# Patient Record
Sex: Female | Born: 1975 | Race: White | Hispanic: No | Marital: Married | Smoking: Never smoker
Health system: Southern US, Community
[De-identification: ages and names within clinical notes are randomized; demographics above are authoritative.]

## PROBLEM LIST (undated history)

## (undated) DIAGNOSIS — I10 Essential (primary) hypertension: Secondary | ICD-10-CM

## (undated) DIAGNOSIS — N83209 Unspecified ovarian cyst, unspecified side: Secondary | ICD-10-CM

---

## 1995-03-21 HISTORY — PX: OVARIAN CYST REMOVAL: SHX89

## 2016-05-23 ENCOUNTER — Other Ambulatory Visit: Payer: Self-pay | Admitting: Family Medicine

## 2016-05-23 DIAGNOSIS — Z1231 Encounter for screening mammogram for malignant neoplasm of breast: Secondary | ICD-10-CM

## 2016-06-09 ENCOUNTER — Ambulatory Visit
Admission: RE | Admit: 2016-06-09 | Discharge: 2016-06-09 | Disposition: A | Discharge status: Home / Self Care | Payer: BLUE CROSS/BLUE SHIELD | Source: Ambulatory Visit | Attending: Family Medicine | Admitting: Family Medicine

## 2016-06-09 DIAGNOSIS — Z1231 Encounter for screening mammogram for malignant neoplasm of breast: Secondary | ICD-10-CM

## 2016-06-12 ENCOUNTER — Other Ambulatory Visit: Payer: Self-pay | Admitting: Family Medicine

## 2016-06-12 DIAGNOSIS — R928 Other abnormal and inconclusive findings on diagnostic imaging of breast: Secondary | ICD-10-CM

## 2016-06-16 ENCOUNTER — Ambulatory Visit
Admission: RE | Admit: 2016-06-16 | Discharge: 2016-06-16 | Disposition: A | Discharge status: Home / Self Care | Payer: BLUE CROSS/BLUE SHIELD | Source: Ambulatory Visit | Attending: Family Medicine | Admitting: Family Medicine

## 2016-06-16 DIAGNOSIS — R928 Other abnormal and inconclusive findings on diagnostic imaging of breast: Secondary | ICD-10-CM

## 2017-06-04 ENCOUNTER — Other Ambulatory Visit: Payer: Self-pay | Admitting: Family Medicine

## 2017-06-04 DIAGNOSIS — Z1231 Encounter for screening mammogram for malignant neoplasm of breast: Secondary | ICD-10-CM

## 2017-06-19 ENCOUNTER — Ambulatory Visit
Admission: RE | Admit: 2017-06-19 | Discharge: 2017-06-19 | Disposition: A | Discharge status: Home / Self Care | Payer: BLUE CROSS/BLUE SHIELD | Source: Ambulatory Visit | Attending: Family Medicine | Admitting: Family Medicine

## 2017-06-19 DIAGNOSIS — Z1231 Encounter for screening mammogram for malignant neoplasm of breast: Secondary | ICD-10-CM

## 2018-03-20 HISTORY — PX: TUBAL LIGATION: SHX77

## 2018-08-19 ENCOUNTER — Other Ambulatory Visit: Payer: Self-pay | Admitting: Internal Medicine

## 2018-08-19 ENCOUNTER — Other Ambulatory Visit: Payer: Self-pay | Admitting: Family Medicine

## 2018-08-19 DIAGNOSIS — Z1231 Encounter for screening mammogram for malignant neoplasm of breast: Secondary | ICD-10-CM

## 2018-08-20 ENCOUNTER — Ambulatory Visit
Admission: RE | Admit: 2018-08-20 | Discharge: 2018-08-20 | Disposition: A | Discharge status: Home / Self Care | Payer: BC Managed Care – PPO | Source: Ambulatory Visit | Attending: Internal Medicine | Admitting: Internal Medicine

## 2018-08-20 ENCOUNTER — Other Ambulatory Visit: Payer: Self-pay

## 2018-08-20 DIAGNOSIS — Z1231 Encounter for screening mammogram for malignant neoplasm of breast: Secondary | ICD-10-CM

## 2019-11-10 ENCOUNTER — Other Ambulatory Visit: Payer: Self-pay | Admitting: Internal Medicine

## 2019-11-10 DIAGNOSIS — Z1231 Encounter for screening mammogram for malignant neoplasm of breast: Secondary | ICD-10-CM

## 2019-11-12 ENCOUNTER — Other Ambulatory Visit: Payer: Self-pay

## 2019-11-12 ENCOUNTER — Ambulatory Visit
Admission: RE | Admit: 2019-11-12 | Discharge: 2019-11-12 | Disposition: A | Discharge status: Home / Self Care | Payer: BLUE CROSS/BLUE SHIELD | Source: Ambulatory Visit | Attending: Internal Medicine | Admitting: Internal Medicine

## 2019-11-12 DIAGNOSIS — Z1231 Encounter for screening mammogram for malignant neoplasm of breast: Secondary | ICD-10-CM

## 2020-12-13 ENCOUNTER — Other Ambulatory Visit: Payer: Self-pay | Admitting: Internal Medicine

## 2020-12-13 DIAGNOSIS — Z1231 Encounter for screening mammogram for malignant neoplasm of breast: Secondary | ICD-10-CM

## 2021-01-06 ENCOUNTER — Ambulatory Visit
Admission: RE | Admit: 2021-01-06 | Discharge: 2021-01-06 | Disposition: A | Discharge status: Home / Self Care | Payer: BC Managed Care – PPO | Source: Ambulatory Visit | Attending: Internal Medicine | Admitting: Internal Medicine

## 2021-01-06 ENCOUNTER — Other Ambulatory Visit: Payer: Self-pay

## 2021-01-06 DIAGNOSIS — Z1231 Encounter for screening mammogram for malignant neoplasm of breast: Secondary | ICD-10-CM

## 2021-01-11 ENCOUNTER — Other Ambulatory Visit: Payer: Self-pay | Admitting: Internal Medicine

## 2021-01-11 DIAGNOSIS — R928 Other abnormal and inconclusive findings on diagnostic imaging of breast: Secondary | ICD-10-CM

## 2021-01-28 ENCOUNTER — Ambulatory Visit
Admission: RE | Admit: 2021-01-28 | Discharge: 2021-01-28 | Disposition: A | Discharge status: Home / Self Care | Payer: BC Managed Care – PPO | Source: Ambulatory Visit | Attending: Internal Medicine | Admitting: Internal Medicine

## 2021-01-28 ENCOUNTER — Other Ambulatory Visit: Payer: Self-pay | Admitting: Internal Medicine

## 2021-01-28 ENCOUNTER — Other Ambulatory Visit: Payer: Self-pay

## 2021-01-28 DIAGNOSIS — R928 Other abnormal and inconclusive findings on diagnostic imaging of breast: Secondary | ICD-10-CM

## 2021-07-29 ENCOUNTER — Other Ambulatory Visit: Payer: BC Managed Care – PPO

## 2022-08-07 ENCOUNTER — Ambulatory Visit (HOSPITAL_BASED_OUTPATIENT_CLINIC_OR_DEPARTMENT_OTHER)
Admission: EM | Admit: 2022-08-07 | Discharge: 2022-08-08 | Disposition: A | Discharge status: Home / Self Care | Payer: BC Managed Care – PPO | Attending: Surgery | Admitting: Surgery

## 2022-08-07 ENCOUNTER — Encounter (HOSPITAL_BASED_OUTPATIENT_CLINIC_OR_DEPARTMENT_OTHER): Payer: Self-pay | Admitting: Urology

## 2022-08-07 ENCOUNTER — Emergency Department (HOSPITAL_BASED_OUTPATIENT_CLINIC_OR_DEPARTMENT_OTHER): Payer: BC Managed Care – PPO

## 2022-08-07 DIAGNOSIS — K3580 Unspecified acute appendicitis: Secondary | ICD-10-CM | POA: Diagnosis present

## 2022-08-07 DIAGNOSIS — I10 Essential (primary) hypertension: Secondary | ICD-10-CM | POA: Insufficient documentation

## 2022-08-07 HISTORY — DX: Unspecified ovarian cyst, unspecified side: N83.209

## 2022-08-07 HISTORY — DX: Essential (primary) hypertension: I10

## 2022-08-07 LAB — COMPREHENSIVE METABOLIC PANEL
ALT: 23 U/L (ref 0–44)
AST: 28 U/L (ref 15–41)
Albumin: 3.6 g/dL (ref 3.5–5.0)
Alkaline Phosphatase: 53 U/L (ref 38–126)
Anion gap: 8 (ref 5–15)
BUN: 11 mg/dL (ref 6–20)
CO2: 28 mmol/L (ref 22–32)
Calcium: 8.6 mg/dL — ABNORMAL LOW (ref 8.9–10.3)
Chloride: 99 mmol/L (ref 98–111)
Creatinine, Ser: 0.76 mg/dL (ref 0.44–1.00)
GFR, Estimated: >60 mL/min (ref >60)
Glucose, Bld: 133 mg/dL — ABNORMAL HIGH (ref 70–99)
Potassium: 3.2 mmol/L — ABNORMAL LOW (ref 3.5–5.1)
Sodium: 135 mmol/L (ref 135–145)
Total Bilirubin: 0.2 mg/dL — ABNORMAL LOW (ref 0.3–1.2)
Total Protein: 6.7 g/dL (ref 6.5–8.1)

## 2022-08-07 LAB — CBC WITH DIFFERENTIAL/PLATELET
Abs Immature Granulocytes: 0.04 10*3/uL (ref 0.00–0.07)
Basophils Absolute: 0 10*3/uL (ref 0.0–0.1)
Basophils Relative: 0 %
Eosinophils Absolute: 0 10*3/uL (ref 0.0–0.5)
Eosinophils Relative: 0 %
HCT: 37.4 % (ref 36.0–46.0)
Hemoglobin: 12.8 g/dL (ref 12.0–15.0)
Immature Granulocytes: 0 %
Lymphocytes Relative: 8 %
Lymphs Abs: 1.3 10*3/uL (ref 0.7–4.0)
MCH: 31.5 pg (ref 26.0–34.0)
MCHC: 34.2 g/dL (ref 30.0–36.0)
MCV: 92.1 fL (ref 80.0–100.0)
Monocytes Absolute: 1.4 10*3/uL — ABNORMAL HIGH (ref 0.1–1.0)
Monocytes Relative: 9 %
Neutro Abs: 12.8 10*3/uL — ABNORMAL HIGH (ref 1.7–7.7)
Neutrophils Relative %: 83 %
Platelets: 255 10*3/uL (ref 150–400)
RBC: 4.06 MIL/uL (ref 3.87–5.11)
RDW: 13.1 % (ref 11.5–15.5)
WBC: 15.6 10*3/uL — ABNORMAL HIGH (ref 4.0–10.5)
nRBC: 0 % (ref 0.0–0.2)

## 2022-08-07 LAB — URINALYSIS, ROUTINE W REFLEX MICROSCOPIC
Bilirubin Urine: NEGATIVE
Glucose, UA: 100 mg/dL — AB
Ketones, ur: NEGATIVE mg/dL
Leukocytes,Ua: NEGATIVE
Nitrite: NEGATIVE
Protein, ur: NEGATIVE mg/dL
Specific Gravity, Urine: 1.01 (ref 1.005–1.030)
pH: 5 (ref 5.0–8.0)

## 2022-08-07 LAB — LIPASE, BLOOD: Lipase: 42 U/L (ref 11–51)

## 2022-08-07 LAB — URINALYSIS, MICROSCOPIC (REFLEX): RBC / HPF: >50 RBC/hpf (ref 0–5)

## 2022-08-07 LAB — HCG, SERUM, QUALITATIVE: Preg, Serum: NEGATIVE

## 2022-08-07 MED ORDER — CIPROFLOXACIN IN D5W 400 MG/200ML IV SOLN
400.0000 mg | Freq: Once | INTRAVENOUS | Status: AC
  Administered 2022-08-07: 400 mg via INTRAVENOUS
  Filled 2022-08-07: qty 200

## 2022-08-07 MED ORDER — IOHEXOL 300 MG/ML  SOLN
100.0000 mL | Freq: Once | INTRAMUSCULAR | Status: AC | PRN
  Administered 2022-08-07: 100 mL via INTRAVENOUS

## 2022-08-07 MED ORDER — FENTANYL CITRATE PF 50 MCG/ML IJ SOSY
50.0000 ug | PREFILLED_SYRINGE | Freq: Once | INTRAMUSCULAR | Status: AC
  Administered 2022-08-07: 50 ug via INTRAVENOUS
  Filled 2022-08-07: qty 1

## 2022-08-07 MED ORDER — ACETAMINOPHEN 325 MG PO TABS
650.0000 mg | ORAL_TABLET | Freq: Once | ORAL | Status: AC
  Administered 2022-08-07: 650 mg via ORAL
  Filled 2022-08-07: qty 2

## 2022-08-07 MED ORDER — SODIUM CHLORIDE 0.9 % IV BOLUS
1000.0000 mL | Freq: Once | INTRAVENOUS | Status: AC
  Administered 2022-08-07: 1000 mL via INTRAVENOUS

## 2022-08-07 MED ORDER — METRONIDAZOLE 500 MG/100ML IV SOLN
500.0000 mg | Freq: Once | INTRAVENOUS | Status: AC
  Administered 2022-08-07: 500 mg via INTRAVENOUS
  Filled 2022-08-07: qty 100

## 2022-08-07 NOTE — ED Provider Notes (Signed)
[place] [facility] Provider Note   CSN: [id number] Arrival date & time: 08/07/22  2017     History  Chief Complaint  Patient presents with   Abdominal Pain    Misty Hester is a 47 y.o. female.  Patient here with right lower quadrant abdominal pain.  Worsening now over the last 24 hours but started maybe 2 to 3 days ago.  History of ovarian cyst on the left.  Currently on her menstrual cycle.  She saw her primary care doctor today who thought she needed to be ruled out for appendicitis.  Supposedly they gave a dose of Toradol and Flomax in the office.  May be prescribed antibiotics but not quite sure for what.  She is not having any pain with urination or blood in the urine.  She has had fallopian tubes removed in the past and left ovary removed in the past.  She denies any history of kidney stones.  No right upper quadrant pain.  No nausea vomiting diarrhea.  She has had a low-grade fever here the last 24 hours.  Denies any pain with urination.  Denies any vaginal discharge.  The history is provided by the patient.       Home Medications Prior to Admission medications   Not on File      Allergies    Penicillins    Review of Systems   Review of Systems  Gastrointestinal:  Positive for abdominal pain.    Physical Exam Updated Vital Signs BP 111/83   Pulse 96   Temp 99.9 F (37.7 C) (Oral)   Resp 17   Ht 5\' 6"  (1.676 m)   Wt 68 kg   LMP 07/31/2022 (Approximate) Comment: IUD rmoved 10/20, irregular periods since.  SpO2 100%   BMI 24.21 kg/m  Physical Exam Vitals and nursing note reviewed.  Constitutional:      General: She is not in acute distress.    Appearance: She is well-developed.  HENT:     Head: Normocephalic and atraumatic.     Mouth/Throat:     Mouth: Mucous membranes are moist.  Eyes:     Extraocular Movements: Extraocular movements intact.     Conjunctiva/sclera: Conjunctivae normal.  Cardiovascular:      Rate and Rhythm: Normal rate and regular rhythm.     Heart sounds: Normal heart sounds. No murmur heard. Pulmonary:     Effort: Pulmonary effort is normal. No respiratory distress.     Breath sounds: Normal breath sounds.  Abdominal:     Palpations: Abdomen is soft.     Tenderness: There is abdominal tenderness in the right lower quadrant.  Musculoskeletal:        General: No swelling.     Cervical back: Neck supple.  Skin:    General: Skin is warm and dry.     Capillary Refill: Capillary refill takes less than 2 seconds.  Neurological:     Mental Status: She is alert.  Psychiatric:        Mood and Affect: Mood normal.     ED Results / Procedures / Treatments   Labs (all labs ordered are listed, but only abnormal results are displayed) Labs Reviewed  CBC WITH DIFFERENTIAL/PLATELET - Abnormal; Notable for the following components:      Result Value   WBC 15.6 (*)    Neutro Abs 12.8 (*)    Monocytes Absolute 1.4 (*)    All other components within normal limits  COMPREHENSIVE METABOLIC PANEL - Abnormal; Notable for the following components:   Potassium 3.2 (*)    Glucose, Bld 133 (*)    Calcium 8.6 (*)    Total Bilirubin 0.2 (*)    All other components within normal limits  URINALYSIS, ROUTINE W REFLEX MICROSCOPIC - Abnormal; Notable for the following components:   Glucose, UA 100 (*)    Hgb urine dipstick SMALL (*)    All other components within normal limits  URINALYSIS, MICROSCOPIC (REFLEX) - Abnormal; Notable for the following components:   Bacteria, UA FEW (*)    All other components within normal limits  LIPASE, BLOOD  HCG, SERUM, QUALITATIVE    EKG None  Radiology CT ABDOMEN PELVIS W CONTRAST  Result Date: 08/07/2022 CLINICAL DATA:  Abdominal pain. EXAM: CT ABDOMEN AND PELVIS WITH CONTRAST TECHNIQUE: Multidetector CT imaging of the abdomen and pelvis was performed using the standard protocol following bolus administration of intravenous contrast.  RADIATION DOSE REDUCTION: This exam was performed according to the departmental dose-optimization program which includes automated exposure control, adjustment of the mA and/or kV according to patient size and/or use of iterative reconstruction technique. CONTRAST:  OMNIPAQUE IOHEXOL 300 MG/ML  SOLN COMPARISON:  None Available. FINDINGS: Lower chest: The visualized lung bases are clear. No intra-abdominal free air or free fluid. Hepatobiliary: No focal liver abnormality is seen. No gallstones, gallbladder wall thickening, or biliary dilatation. Pancreas: Unremarkable. No pancreatic ductal dilatation or surrounding inflammatory changes. Spleen: Normal in size without focal abnormality. Adrenals/Urinary Tract: The right adrenal glands unremarkable. There is a 2 cm indeterminate left adrenal nodule, possibly adenoma. This can be better characterized with MRI without and with contrast on a nonemergent/outpatient basis. Small left renal upper pole cyst. There is no hydronephrosis on either side. The visualized ureters and the urinary bladder appear unremarkable. Stomach/Bowel: Several small scattered colonic diverticula without active inflammatory changes. There is no bowel obstruction. The appendix is enlarged and inflamed measuring up to 11 mm in diameter. There is a stone in the lumen of the appendix. The appendix is located in the right lower quadrant posterior to the cecum and ascending colon. No drainable fluid collection/abscess. No evidence of perforation. Vascular/Lymphatic: The abdominal aorta and IVC are unremarkable. No portal venous gas. There is no adenopathy. Reproductive: The uterus is anteverted.  No adnexal masses. Other: None Musculoskeletal: No acute or significant osseous findings. IMPRESSION: 1. Acute appendicitis. No abscess or evidence of perforation. 2. Colonic diverticulosis. No bowel obstruction. 3. Indeterminate left adrenal nodule, possibly adenoma. This can be better characterized with  MRI without and with contrast on a nonemergent/outpatient basis. At Electronically Signed   By: Elgie Collard M.D.   On: 08/07/2022 21:45    Procedures Procedures    Medications Ordered in ED Medications  ciprofloxacin (CIPRO) IVPB 400 mg (has no administration in time range)    And  metroNIDAZOLE (FLAGYL) IVPB 500 mg (has no administration in time range)  acetaminophen (TYLENOL) tablet 650 mg (650 mg Oral Given 08/07/22 2048)  sodium chloride 0.9 % bolus 1,000 mL (1,000 mLs Intravenous New Bag/Given 08/07/22 2050)  fentaNYL (SUBLIMAZE) injection 50 mcg (50 mcg Intravenous Given 08/07/22 2048)  iohexol (OMNIPAQUE) 300 MG/ML solution 100 mL (100 mLs Intravenous Contrast Given 08/07/22 2123)    ED Course/ Medical Decision Making/ A&P                             Medical Decision Making Amount and/or  Complexity of Data Reviewed Labs: ordered. Radiology: ordered.  Risk OTC drugs. Prescription drug management. Decision regarding hospitalization.   Garlan Fair is here with right lower quadrant abdominal pain.  Low-grade fever, tachycardia.  Differential diagnosis includes appendicitis versus UTI versus less likely bowel obstruction, ovarian process.  Patient to be given IV fluids, IV fentanyl, IV Zofran.  Per my review and interpretation of labs she has a white count of 15 but otherwise hemodynamically stable.  Lab works unremarkable.  CT scan per radiology report shows acute appendicitis.  Talked with Dr. Maisie Fus with general surgery.  Will keep her here overnight and she will be sent to preop in the morning.  Patient handed off to oncoming ED staff with plan in place.  This chart was dictated using voice recognition software.  Despite best efforts to proofread,  errors can occur which can change the documentation meaning.         Final Clinical Impression(s) / ED Diagnoses Final diagnoses:  Acute appendicitis, unspecified acute appendicitis type    Rx / DC  Orders ED Discharge Orders     None         Virgina Norfolk, DO 08/07/22 2204

## 2022-08-07 NOTE — ED Triage Notes (Signed)
Pt states pain that started in lower back on Saturday and now having RLQ pain, PCP sent to r/o appendicitis  Denies N/V/D, low grade fever noted at home   Toradol and flomax at 1600 at office Last ate at 1800

## 2022-08-07 NOTE — ED Notes (Signed)
Pt c/o RLQ pain that began in her flank area a couple days ago but is now only in her abdomen. Denies NVD. Sts possible fevers at home. Hx of ovarian cysts on the left side that made her left ovary rupture but has right ovary. Currently on her menstrual cycle.

## 2022-08-08 ENCOUNTER — Emergency Department (HOSPITAL_COMMUNITY): Payer: BC Managed Care – PPO | Admitting: Anesthesiology

## 2022-08-08 ENCOUNTER — Other Ambulatory Visit: Payer: Self-pay

## 2022-08-08 ENCOUNTER — Encounter (HOSPITAL_COMMUNITY): Payer: Self-pay

## 2022-08-08 ENCOUNTER — Encounter (HOSPITAL_COMMUNITY)
Admission: EM | Disposition: A | Discharge status: Home / Self Care | Payer: Self-pay | Source: Home / Self Care | Attending: Emergency Medicine

## 2022-08-08 DIAGNOSIS — I10 Essential (primary) hypertension: Secondary | ICD-10-CM | POA: Diagnosis not present

## 2022-08-08 DIAGNOSIS — K3580 Unspecified acute appendicitis: Secondary | ICD-10-CM | POA: Diagnosis present

## 2022-08-08 HISTORY — PX: LAPAROSCOPIC APPENDECTOMY: SHX408

## 2022-08-08 SURGERY — APPENDECTOMY, LAPAROSCOPIC
Anesthesia: General

## 2022-08-08 MED ORDER — LIDOCAINE HCL (CARDIAC) PF 100 MG/5ML IV SOSY
PREFILLED_SYRINGE | INTRAVENOUS | Status: DC | PRN
  Administered 2022-08-08: 100 mg via INTRAVENOUS

## 2022-08-08 MED ORDER — 0.9 % SODIUM CHLORIDE (POUR BTL) OPTIME
TOPICAL | Status: DC | PRN
  Administered 2022-08-08: 1000 mL

## 2022-08-08 MED ORDER — LACTATED RINGERS IR SOLN
Status: DC | PRN
  Administered 2022-08-08: 1000 mL

## 2022-08-08 MED ORDER — FENTANYL CITRATE PF 50 MCG/ML IJ SOSY
50.0000 ug | PREFILLED_SYRINGE | INTRAMUSCULAR | Status: DC | PRN
  Administered 2022-08-08: 50 ug via INTRAVENOUS
  Filled 2022-08-08: qty 1

## 2022-08-08 MED ORDER — SUCCINYLCHOLINE CHLORIDE 200 MG/10ML IV SOSY
PREFILLED_SYRINGE | INTRAVENOUS | Status: AC
  Filled 2022-08-08: qty 10

## 2022-08-08 MED ORDER — PROPOFOL 10 MG/ML IV BOLUS
INTRAVENOUS | Status: AC
  Filled 2022-08-08: qty 20

## 2022-08-08 MED ORDER — ROCURONIUM BROMIDE 100 MG/10ML IV SOLN
INTRAVENOUS | Status: DC | PRN
  Administered 2022-08-08: 40 mg via INTRAVENOUS

## 2022-08-08 MED ORDER — FENTANYL CITRATE (PF) 100 MCG/2ML IJ SOLN
INTRAMUSCULAR | Status: AC
  Filled 2022-08-08: qty 2

## 2022-08-08 MED ORDER — BUPIVACAINE HCL (PF) 0.5 % IJ SOLN
INTRAMUSCULAR | Status: AC
  Filled 2022-08-08: qty 60

## 2022-08-08 MED ORDER — ACETAMINOPHEN 10 MG/ML IV SOLN: 1000.0000 mg | Freq: Once | INTRAVENOUS | Status: DC | PRN

## 2022-08-08 MED ORDER — ONDANSETRON HCL 4 MG/2ML IJ SOLN
INTRAMUSCULAR | Status: DC | PRN
  Administered 2022-08-08: 4 mg via INTRAVENOUS

## 2022-08-08 MED ORDER — FENTANYL CITRATE (PF) 100 MCG/2ML IJ SOLN
INTRAMUSCULAR | Status: DC | PRN
  Administered 2022-08-08 (×4): 50 ug via INTRAVENOUS

## 2022-08-08 MED ORDER — ONDANSETRON HCL 4 MG/2ML IJ SOLN
INTRAMUSCULAR | Status: AC
  Filled 2022-08-08: qty 2

## 2022-08-08 MED ORDER — CIPROFLOXACIN IN D5W 400 MG/200ML IV SOLN
400.0000 mg | Freq: Once | INTRAVENOUS | Status: AC
  Administered 2022-08-08: 400 mg via INTRAVENOUS
  Filled 2022-08-08: qty 200

## 2022-08-08 MED ORDER — MIDAZOLAM HCL 5 MG/5ML IJ SOLN
INTRAMUSCULAR | Status: DC | PRN
  Administered 2022-08-08: 2 mg via INTRAVENOUS

## 2022-08-08 MED ORDER — SUGAMMADEX SODIUM 200 MG/2ML IV SOLN
INTRAVENOUS | Status: DC | PRN
  Administered 2022-08-08: 200 mg via INTRAVENOUS

## 2022-08-08 MED ORDER — LACTATED RINGERS IV SOLN: INTRAVENOUS | Status: DC

## 2022-08-08 MED ORDER — PROPOFOL 10 MG/ML IV BOLUS
INTRAVENOUS | Status: DC | PRN
  Administered 2022-08-08: 140 mg via INTRAVENOUS

## 2022-08-08 MED ORDER — CHLORHEXIDINE GLUCONATE 0.12 % MT SOLN
15.0000 mL | Freq: Once | OROMUCOSAL | Status: AC
  Administered 2022-08-08: 15 mL via OROMUCOSAL

## 2022-08-08 MED ORDER — BUPIVACAINE HCL (PF) 0.5 % IJ SOLN
INTRAMUSCULAR | Status: DC | PRN
  Administered 2022-08-08: 20 mL

## 2022-08-08 MED ORDER — ROCURONIUM BROMIDE 10 MG/ML (PF) SYRINGE
PREFILLED_SYRINGE | INTRAVENOUS | Status: AC
  Filled 2022-08-08: qty 10

## 2022-08-08 MED ORDER — LIDOCAINE HCL (PF) 2 % IJ SOLN
INTRAMUSCULAR | Status: AC
  Filled 2022-08-08: qty 5

## 2022-08-08 MED ORDER — MIDAZOLAM HCL 2 MG/2ML IJ SOLN
INTRAMUSCULAR | Status: AC
  Filled 2022-08-08: qty 2

## 2022-08-08 MED ORDER — OXYCODONE HCL 5 MG PO TABS
ORAL_TABLET | ORAL | Status: AC
  Filled 2022-08-08: qty 1

## 2022-08-08 MED ORDER — SUCCINYLCHOLINE CHLORIDE 200 MG/10ML IV SOSY
PREFILLED_SYRINGE | INTRAVENOUS | Status: DC | PRN
  Administered 2022-08-08: 100 mg via INTRAVENOUS

## 2022-08-08 MED ORDER — DEXAMETHASONE SODIUM PHOSPHATE 10 MG/ML IJ SOLN
INTRAMUSCULAR | Status: AC
  Filled 2022-08-08: qty 1

## 2022-08-08 MED ORDER — DEXAMETHASONE SODIUM PHOSPHATE 10 MG/ML IJ SOLN
INTRAMUSCULAR | Status: DC | PRN
  Administered 2022-08-08: 8 mg via INTRAVENOUS

## 2022-08-08 MED ORDER — FENTANYL CITRATE PF 50 MCG/ML IJ SOSY: 25.0000 ug | PREFILLED_SYRINGE | INTRAMUSCULAR | Status: DC | PRN

## 2022-08-08 MED ORDER — AMOXICILLIN-POT CLAVULANATE 875-125 MG PO TABS: 1.0000 | ORAL_TABLET | Freq: Two times a day (BID) | ORAL | 0 refills | Status: AC

## 2022-08-08 MED ORDER — OXYCODONE HCL 5 MG/5ML PO SOLN: 5.0000 mg | Freq: Once | ORAL | Status: AC | PRN

## 2022-08-08 MED ORDER — TRAMADOL HCL 50 MG PO TABS: 50.0000 mg | ORAL_TABLET | Freq: Four times a day (QID) | ORAL | 0 refills | Status: AC | PRN

## 2022-08-08 MED ORDER — METRONIDAZOLE 500 MG/100ML IV SOLN
500.0000 mg | Freq: Once | INTRAVENOUS | Status: AC
  Administered 2022-08-08: 500 mg via INTRAVENOUS
  Filled 2022-08-08: qty 100

## 2022-08-08 MED ORDER — OXYCODONE HCL 5 MG PO TABS
5.0000 mg | ORAL_TABLET | Freq: Once | ORAL | Status: AC | PRN
  Administered 2022-08-08: 5 mg via ORAL

## 2022-08-08 MED ORDER — ONDANSETRON HCL 4 MG/2ML IJ SOLN: 4.0000 mg | Freq: Once | INTRAMUSCULAR | Status: DC | PRN

## 2022-08-08 MED ORDER — SODIUM CHLORIDE 0.9 % IV SOLN: Freq: Once | INTRAVENOUS | Status: AC

## 2022-08-08 SURGICAL SUPPLY — 39 items
ADH SKN CLS APL DERMABOND .7 (GAUZE/BANDAGES/DRESSINGS) ×1
APL PRP STRL LF DISP 70% ISPRP (MISCELLANEOUS) ×1
APPLIER CLIP 5 13 M/L LIGAMAX5 (MISCELLANEOUS)
APR CLP MED LRG 5 ANG JAW (MISCELLANEOUS)
BAG COUNTER SPONGE SURGICOUNT (BAG) IMPLANT
BAG SPNG CNTER NS LX DISP (BAG)
CABLE HIGH FREQUENCY MONO STRZ (ELECTRODE) ×1 IMPLANT
CHLORAPREP W/TINT 26 (MISCELLANEOUS) ×1 IMPLANT
CLIP APPLIE 5 13 M/L LIGAMAX5 (MISCELLANEOUS) IMPLANT
COVER SURGICAL LIGHT HANDLE (MISCELLANEOUS) ×1 IMPLANT
CUTTER FLEX LINEAR 45M (STAPLE) IMPLANT
DERMABOND ADVANCED .7 DNX12 (GAUZE/BANDAGES/DRESSINGS) ×1 IMPLANT
DRAPE LAPAROSCOPIC ABDOMINAL (DRAPES) ×1 IMPLANT
ELECT REM PT RETURN 15FT ADLT (MISCELLANEOUS) ×1 IMPLANT
GLOVE BIO SURGEON STRL SZ7.5 (GLOVE) ×1 IMPLANT
GOWN STRL REUS W/ TWL XL LVL3 (GOWN DISPOSABLE) ×2 IMPLANT
GOWN STRL REUS W/TWL XL LVL3 (GOWN DISPOSABLE) ×2
IRRIG SUCT STRYKERFLOW 2 WTIP (MISCELLANEOUS) ×1
IRRIGATION SUCT STRKRFLW 2 WTP (MISCELLANEOUS) ×1 IMPLANT
KIT BASIN OR (CUSTOM PROCEDURE TRAY) ×1 IMPLANT
KIT TURNOVER KIT A (KITS) IMPLANT
PENCIL SMOKE EVACUATOR (MISCELLANEOUS) IMPLANT
RELOAD 45 VASCULAR/THIN (ENDOMECHANICALS) IMPLANT
RELOAD STAPLE 45 2.5 WHT GRN (ENDOMECHANICALS) IMPLANT
RELOAD STAPLE 45 3.5 BLU ETS (ENDOMECHANICALS) IMPLANT
RELOAD STAPLE TA45 3.5 REG BLU (ENDOMECHANICALS) ×1 IMPLANT
SET TUBE SMOKE EVAC HIGH FLOW (TUBING) ×1 IMPLANT
SHEARS HARMONIC ACE PLUS 36CM (ENDOMECHANICALS) IMPLANT
SLEEVE Z-THREAD 5X100MM (TROCAR) ×1 IMPLANT
SPIKE FLUID TRANSFER (MISCELLANEOUS) ×1 IMPLANT
SUT MNCRL AB 4-0 PS2 18 (SUTURE) ×1 IMPLANT
SYS BAG RETRIEVAL 10MM (BASKET) ×1
SYSTEM BAG RETRIEVAL 10MM (BASKET) ×1 IMPLANT
TOWEL OR 17X26 10 PK STRL BLUE (TOWEL DISPOSABLE) ×1 IMPLANT
TOWEL OR NON WOVEN STRL DISP B (DISPOSABLE) ×1 IMPLANT
TRAY FOLEY MTR SLVR 16FR STAT (SET/KITS/TRAYS/PACK) ×1 IMPLANT
TRAY LAPAROSCOPIC (CUSTOM PROCEDURE TRAY) ×1 IMPLANT
TROCAR BALLN 12MMX100 BLUNT (TROCAR) ×1 IMPLANT
TROCAR Z-THREAD OPTICAL 5X100M (TROCAR) ×1 IMPLANT

## 2022-08-08 NOTE — Transfer of Care (Signed)
Immediate Anesthesia Transfer of Care Note  Patient: Misty Hester [facility]  Procedure(s) Performed: APPENDECTOMY LAPAROSCOPIC  Patient Location: PACU  Anesthesia Type:General  Level of Consciousness: awake, alert , and oriented  Airway & Oxygen Therapy: Patient Spontanous Breathing and Patient connected to face mask oxygen  Post-op Assessment: Report given to RN and Post -op Vital signs reviewed and stable  Post vital signs: Reviewed and stable  Last Vitals:  Vitals Value Taken Time  BP 119/78 08/08/22 1130  Temp 37.2 C 08/08/22 1130  Pulse 91 08/08/22 1131  Resp 18 08/08/22 1131  SpO2 100 % 08/08/22 1131  Vitals shown include unvalidated device data.  Last Pain:  Vitals:   08/08/22 1130  TempSrc:   PainSc: 4          Complications: No notable events documented.

## 2022-08-08 NOTE — Op Note (Signed)
APPENDECTOMY LAPAROSCOPIC  Procedure Note  Misty Hester [place] Day 08/08/2022   Pre-op Diagnosis: acute appendicitis     Post-op Diagnosis: Same  Procedure(s): APPENDECTOMY LAPAROSCOPIC  Surgeon(s): Abigail Miyamoto, MD  Anesthesia: General  Staff:  Circulator: Martina Sinner, RN; Selena Lesser, RN Scrub Person: Caleb Popp Circulator Assistant: Stevie Kern, RN Float Surgical Tech: Tilden Fossa  Estimated Blood Loss: Minimal               Specimens: Sent to pathology  Indications: This is a 47 year old female who presented with right lower quadrant abdominal pain to [employer].  She was found to have acute retrocecal appendicitis with an appendicolith.  The decision was made to proceed to the operating room for laparoscopic appendectomy  Findings: The patient was found to have appendicitis with a retrocecal appendix with necrosis of the tip of the appendix but no perforation or abscess.  Procedure: The patient was brought to the operating identified as correct patient.  She was placed upon the operating table and general anesthesia was induced.  Her abdomen was then prepped and draped in usual sterile fashion.  I made a vertical incision below the umbilicus with a scalpel.  I carried this down to the fascia which was then opened the scalpel as well.  I then used a Kelly clamp to gain access into the abdominal cavity under direct vision.  A 0 Vicryl pursing suture was placed around the fascial opening.  The [facility] port was placed at the opening and insufflation of the abdomen was begun.  I next placed a 5 mm trocar in the patient's right upper quadrant and another in the left lower quadrant both under direct vision.  The cecum and terminal ileum were identified.  I rotated this medially and identified the retrocecal appendix.  It was fairly fixated into the retrocecal location.  I dissected out the base of the appendix and transected it with a  laparoscopic GIA stapler.  I then slowly dissected the appendix free from the lateral wall of the colon with blunt dissection as well as the harmonic scalpel for the mesentery.  Once the appendix was removed it was placed in an Endosac and removed the incision at the umbilicus.  I then copiously irrigated the abdomen with saline.  Hemostasis appeared to be achieved staple line.  Again there was no evidence of gross perforation or abscess.  At this point the trocar the umbilicus was removed and the 0 Vicryl was tied in place closing the fascial defect.  The abdomen was deflated as the ports were removed.  All incisions were then anesthetized Marcaine and closed with 4-0 Monocryl sutures.  Dermabond was then applied.  The patient tolerated the procedure well.  All the counts were correct at the end of the procedure.  The patient was then extubated in the operating room and taken in a stable condition to the recovery room.          Abigail Miyamoto   Date: 08/08/2022  Time: 11:20 AM

## 2022-08-08 NOTE — Anesthesia Preprocedure Evaluation (Signed)
Anesthesia Evaluation  Patient identified by MRN, date of birth, ID band Patient awake    Reviewed: Allergy & Precautions, H&P , NPO status , Patient's Chart, lab work & pertinent test results  Airway Mallampati: II  TM Distance: >3 FB Neck ROM: Full    Dental no notable dental hx.    Pulmonary neg pulmonary ROS   Pulmonary exam normal breath sounds clear to auscultation       Cardiovascular hypertension, Normal cardiovascular exam Rhythm:Regular Rate:Normal     Neuro/Psych negative neurological ROS  negative psych ROS   GI/Hepatic negative GI ROS, Neg liver ROS,,,  Endo/Other  negative endocrine ROS    Renal/GU negative Renal ROS  negative genitourinary   Musculoskeletal negative musculoskeletal ROS (+)    Abdominal   Peds negative pediatric ROS (+)  Hematology negative hematology ROS (+)   Anesthesia Other Findings   Reproductive/Obstetrics negative OB ROS                             Anesthesia Physical Anesthesia Plan  ASA: 2  Anesthesia Plan: General   Post-op Pain Management: Toradol IV (intra-op)*   Induction: Intravenous and Rapid sequence  PONV Risk Score and Plan: 3 and Ondansetron, Dexamethasone, Midazolam and Treatment may vary due to age or medical condition  Airway Management Planned: Oral ETT  Additional Equipment:   Intra-op Plan:   Post-operative Plan: Extubation in OR  Informed Consent: I have reviewed the patients History and Physical, chart, labs and discussed the procedure including the risks, benefits and alternatives for the proposed anesthesia with the patient or authorized representative who has indicated his/her understanding and acceptance.     Dental advisory given  Plan Discussed with: CRNA and Surgeon  Anesthesia Plan Comments:        Anesthesia Quick Evaluation

## 2022-08-08 NOTE — Progress Notes (Signed)
Spoke with Dr Magnus Ivan about order. He stated they would work on getting them in.

## 2022-08-08 NOTE — Discharge Instructions (Addendum)
[facility] ° °Please arrive at least 30 min before your appointment to complete your check in paperwork.  If you are unable to arrive 30 min prior to your appointment time we may have to cancel or reschedule you. °LAPAROSCOPIC SURGERY: POST OP INSTRUCTIONS °Always review your discharge instruction sheet given to you by the facility where your surgery was performed. °IF YOU HAVE DISABILITY OR FAMILY LEAVE FORMS, YOU MUST BRING THEM TO THE OFFICE FOR PROCESSING.   °DO NOT GIVE THEM TO YOUR DOCTOR. ° °PAIN CONTROL ° °First take acetaminophen (Tylenol) AND/or ibuprofen (Advil) to control your pain after surgery.  Follow directions on package.  Taking acetaminophen (Tylenol) and/or ibuprofen (Advil) regularly after surgery will help to control your pain and lower the amount of prescription pain medication you may need.  You should not take more than 4,000 mg (4 grams) of acetaminophen (Tylenol) in 24 hours.  You should not take ibuprofen (Advil), aleve, motrin, naprosyn or other NSAIDS if you have a history of stomach ulcers or chronic kidney disease.  °A prescription for pain medication may be given to you upon discharge.  Take your pain medication as prescribed, if you still have uncontrolled pain after taking acetaminophen (Tylenol) or ibuprofen (Advil). °Use ice packs to help control pain. °If you need a refill on your pain medication, please contact your pharmacy.  They will contact our office to request authorization. Prescriptions will not be filled after 5pm or on week-ends. ° °HOME MEDICATIONS °Take your usually prescribed medications unless otherwise directed. ° °DIET °You should follow a light diet the first few days after arrival home.  Be sure to include lots of fluids daily. Avoid fatty, fried foods.  ° °CONSTIPATION °It is common to experience some constipation after surgery and if you are taking pain medication.  Increasing fluid intake and taking a stool softener (such as Colace)  will usually help or prevent this problem from occurring.  A mild laxative (Milk of Magnesia or Miralax) should be taken according to package instructions if there are no bowel movements after 48 hours. ° °WOUND/INCISION CARE °Most patients will experience some swelling and bruising in the area of the incisions.  Ice packs will help.  Swelling and bruising can take several days to resolve.  °Unless discharge instructions indicate otherwise, follow guidelines below  °STERI-STRIPS - you may remove your outer bandages 48 hours after surgery, and you may shower at that time.  You have steri-strips (small skin tapes) in place directly over the incision.  These strips should be left on the skin for 7-10 days.   °DERMABOND/SKIN GLUE - you may shower in 24 hours.  The glue will flake off over the next 2-3 weeks. °Any sutures or staples will be removed at the office during your follow-up visit. ° °ACTIVITIES °You may resume regular (light) daily activities beginning the next day--such as daily self-care, walking, climbing stairs--gradually increasing activities as tolerated.  You may have sexual intercourse when it is comfortable.  Refrain from any heavy lifting or straining until approved by your doctor. °You may drive when you are no longer taking prescription pain medication, you can comfortably wear a seatbelt, and you can safely maneuver your car and apply brakes. ° °FOLLOW-UP °You should see your doctor in the office for a follow-up appointment approximately 2-3 weeks after your surgery.  You should have been given your post-op/follow-up appointment when your surgery was scheduled.  If you did not receive a post-op/follow-up appointment, make sure   that you call for this appointment within a day or two after you arrive home to insure a convenient appointment time. ° ° °WHEN TO CALL YOUR DOCTOR: °Fever over 101.0 °Inability to urinate °Continued bleeding from incision. °Increased pain, redness, or drainage from the  incision. °Increasing abdominal pain ° °The clinic staff is available to answer your questions during regular business hours.  Please don’t hesitate to call and ask to speak to one of the nurses for clinical concerns.  If you have a medical emergency, go to the nearest emergency room or call 911.  A surgeon from [facility] is always on call at the hospital. °[address], [address], [place], [place]  [address] ? [address] [address], [place], [place]   [address] °[phone] ? [phone] ? FAX [phone] ° ° ° ° °Managing Your Pain After Surgery Without Opioids ° ° ° °Thank you for participating in our program to help patients manage their pain after surgery without opioids. This is part of our effort to provide you with the best care possible, without exposing you or your family to the risk that opioids pose. ° °What pain can I expect after surgery? °You can expect to have some pain after surgery. This is normal. The pain is typically worse the day after surgery, and quickly begins to get better. °Many studies have found that many patients are able to manage their pain after surgery with Over-the-Counter (OTC) medications such as Tylenol and Motrin. If you have a condition that does not allow you to take Tylenol or Motrin, notify your surgical team. ° °How will I manage my pain? °The best strategy for controlling your pain after surgery is around the clock pain control with Tylenol (acetaminophen) and Motrin (ibuprofen or Advil). Alternating these medications with each other allows you to maximize your pain control. In addition to Tylenol and Motrin, you can use heating pads or ice packs on your incisions to help reduce your pain. ° °How will I alternate your regular strength over-the-counter pain medication? °You will take a dose of pain medication every three hours. °Start by taking 650 mg of Tylenol (2 pills of 325 mg) °3 hours later take 600 mg of Motrin (3 pills of 200 mg) °3 hours after  taking the Motrin take 650 mg of Tylenol °3 hours after that take 600 mg of Motrin. ° ° °- 1 - ° °See example - if your first dose of Tylenol is at 12:00 PM ° ° °12:00 PM Tylenol 650 mg (2 pills of 325 mg)  °3:00 PM Motrin 600 mg (3 pills of 200 mg)  °6:00 PM Tylenol 650 mg (2 pills of 325 mg)  °9:00 PM Motrin 600 mg (3 pills of 200 mg)  °Continue alternating every 3 hours  ° °We recommend that you follow this schedule around-the-clock for at least 3 days after surgery, or until you feel that it is no longer needed. Use the table on the last page of this handout to keep track of the medications you are taking. °Important: °Do not take more than 3000mg of Tylenol or 3200mg of Motrin in a 24-hour period. °Do not take ibuprofen/Motrin if you have a history of bleeding stomach ulcers, severe kidney disease, &/or actively taking a blood thinner ° °What if I still have pain? °If you have pain that is not controlled with the over-the-counter pain medications (Tylenol and Motrin or Advil) you might have what we call “breakthrough” pain. You will receive a prescription   for a small amount of an opioid pain medication such as Oxycodone, Tramadol, or Tylenol with Codeine. Use these opioid pills in the first 24 hours after surgery if you have breakthrough pain. Do not take more than 1 pill every 4-6 hours. ° °If you still have uncontrolled pain after using all opioid pills, don't hesitate to call our staff using the number provided. We will help make sure you are managing your pain in the best way possible, and if necessary, we can provide a prescription for additional pain medication. ° ° °Day 1   ° °Time  °Name of Medication Number of pills taken  °Amount of Acetaminophen  °Pain Level  ° °Comments  °AM PM       °AM PM       °AM PM       °AM PM       °AM PM       °AM PM       °AM PM       °AM PM       °Total Daily amount of Acetaminophen °Do not take more than  3,000 mg per day    ° ° °Day 2   ° °Time  °Name of Medication  Number of pills °taken  °Amount of Acetaminophen  °Pain Level  ° °Comments  °AM PM       °AM PM       °AM PM       °AM PM       °AM PM       °AM PM       °AM PM       °AM PM       °Total Daily amount of Acetaminophen °Do not take more than  3,000 mg per day    ° ° °Day 3   ° °Time  °Name of Medication Number of pills taken  °Amount of Acetaminophen  °Pain Level  ° °Comments  °AM PM       °AM PM       °AM PM       °AM PM       ° ° ° °AM PM       °AM PM       °AM PM       °AM PM       °Total Daily amount of Acetaminophen °Do not take more than  3,000 mg per day    ° ° °Day 4   ° °Time  °Name of Medication Number of pills taken  °Amount of Acetaminophen  °Pain Level  ° °Comments  °AM PM       °AM PM       °AM PM       °AM PM       °AM PM       °AM PM       °AM PM       °AM PM       °Total Daily amount of Acetaminophen °Do not take more than  3,000 mg per day    ° ° °Day 5   ° °Time  °Name of Medication Number °of pills taken  °Amount of Acetaminophen  °Pain Level  ° °Comments  °AM PM       °AM PM       °AM PM       °AM PM       °AM PM       °AM   PM       °AM PM       °AM PM       °Total Daily amount of Acetaminophen °Do not take more than  3,000 mg per day    ° ° ° °Day 6   ° °Time  °Name of Medication Number of pills °taken  °Amount of Acetaminophen  °Pain Level  °Comments  °AM PM       °AM PM       °AM PM       °AM PM       °AM PM       °AM PM       °AM PM       °AM PM       °Total Daily amount of Acetaminophen °Do not take more than  3,000 mg per day    ° ° °Day 7   ° °Time  °Name of Medication Number of pills taken  °Amount of Acetaminophen  °Pain Level  ° °Comments  °AM PM       °AM PM       °AM PM       °AM PM       °AM PM       °AM PM       °AM PM       °AM PM       °Total Daily amount of Acetaminophen °Do not take more than  3,000 mg per day    ° ° ° ° °For additional information about how and where to safely dispose of unused opioid °medications - https://www.morepowerfulnc.org ° °Disclaimer: This document  contains information and/or instructional materials adapted from [facility] for the typical patient with your condition. It does not replace medical advice from your health care provider because your experience may differ from that of the °typical patient. Talk to your health care provider if you have any questions about this °document, your condition or your treatment plan. °Adapted from [facility] ° °

## 2022-08-08 NOTE — Anesthesia Procedure Notes (Signed)
Procedure Name: Intubation Date/Time: 08/08/2022 10:50 AM  Performed by: Cleda Clarks, CRNAPre-anesthesia Checklist: Patient identified, Emergency Drugs available, Suction available and Patient being monitored Patient Re-evaluated:Patient Re-evaluated prior to induction Oxygen Delivery Method: Circle system utilized Preoxygenation: Pre-oxygenation with 100% oxygen Induction Type: IV induction, Cricoid Pressure applied and Rapid sequence Laryngoscope Size: Miller and 2 Tube type: Oral Tube size: 7.0 mm Number of attempts: 1 Airway Equipment and Method: Stylet and Oral airway Placement Confirmation: ETT inserted through vocal cords under direct vision, positive ETCO2 and breath sounds checked- equal and bilateral Secured at: 21 cm Tube secured with: Tape Dental Injury: Teeth and Oropharynx as per pre-operative assessment

## 2022-08-08 NOTE — Anesthesia Postprocedure Evaluation (Signed)
Anesthesia Post Note  Patient: Misty Hester [facility]  Procedure(s) Performed: APPENDECTOMY LAPAROSCOPIC     Patient location during evaluation: PACU Anesthesia Type: General Level of consciousness: awake and alert Pain management: pain level controlled Vital Signs Assessment: post-procedure vital signs reviewed and stable Respiratory status: spontaneous breathing, nonlabored ventilation, respiratory function stable and patient connected to nasal cannula oxygen Cardiovascular status: blood pressure returned to baseline and stable Postop Assessment: no apparent nausea or vomiting Anesthetic complications: no  No notable events documented.  Last Vitals:  Vitals:   08/08/22 1130 08/08/22 1145  BP: 119/78 110/74  Pulse: 98 94  Resp: 14 17  Temp: 37.2 C   SpO2: 100% 94%    Last Pain:  Vitals:   08/08/22 1145  TempSrc:   PainSc: 2                  Colin Ellers S

## 2022-08-08 NOTE — ED Provider Notes (Signed)
Signout from Dr. Daun Peacock.  47 year old female boarding here awaiting transport to [facility] for acute appendicitis.  Antibiotics have been given. Physical Exam  BP 110/79   Pulse 99   Temp 99.9 F (37.7 C) (Oral)   Resp 15   Ht 5\' 6"  (1.676 m)   Wt 68 kg   LMP 07/31/2022 (Approximate) Comment: IUD rmoved 10/20, irregular periods since.  SpO2 98%   BMI 24.21 kg/m   Physical Exam  Procedures  Procedures  ED Course / MDM    Medical Decision Making Amount and/or Complexity of Data Reviewed Labs: ordered. Radiology: ordered.  Risk OTC drugs. Prescription drug management.   Reached out to general surgery PA Kabrich.  She is going to check with the OR for availability  7:30 AM.  Surgery saying that patient has been posted to the OR under Dr. Magnus Ivan.  Call [facility] to transport over to preop.     Terrilee Files, MD 08/09/22 [phone]

## 2022-08-08 NOTE — ED Notes (Signed)
Call to OR to provide report, spoke to [facility] RN,  Pt to maintain NPO prior to transfer

## 2022-08-08 NOTE — H&P (Addendum)
Misty Hester Earlene Plater is an 47 y.o. female.   Chief Complaint: RLQ abdominal pain HPI: This is a pleasant 47 year old female who presents transferred from med [facility].  She presented there with right lower quadrant abdominal pain which started on Saturday.  Last evening she had a CT scan which showed a retrocecal appendix with signs of appendicitis and appendicolith.  She is otherwise healthy without complaints.  She has no nausea or vomiting.  She did have a low-grade fever.  She is otherwise healthy without complaints.  She has had a previous left ovarian cystectomy and has a right salpingectomy with removal and IUD more recently in 2020.  She has no cardiopulmonary issues and no problems with anesthesia.  Past Medical History:  Diagnosis Date   Hypertension    Ovarian cyst     Past Surgical History:  Procedure Laterality Date   OVARIAN CYST REMOVAL Left 1997   TUBAL LIGATION Bilateral 2020    History reviewed. No pertinent family history. Social History:  reports that she has never smoked. She has never used smokeless tobacco. She reports that she does not drink alcohol and does not use drugs.  Allergies:  Allergies  Allergen Reactions   Penicillins Rash    Rash - "happened when I was a kid and never had again"     Medications Prior to Admission  Medication Sig Dispense Refill   estradiol (CLIMARA - DOSED IN MG/24 HR) 0.075 mg/24hr patch Place 0.075 mg onto the skin once a week.     progesterone (PROMETRIUM) 100 MG capsule Take 100 mg by mouth daily. Takes two tablets HS for a total of 200 mg/daily     thyroid (ARMOUR) 30 MG tablet Take 30 mg by mouth daily before breakfast.     triamterene-hydrochlorothiazide (MAXZIDE-25) 37.5-25 MG tablet Take 0.5 tablets by mouth daily. Patient takes 1/2 tab daily      Results for orders placed or performed during the hospital encounter of 08/07/22 (from the past 48 hour(s))  Urinalysis, Routine w reflex microscopic -Urine, Clean  Catch     Status: Abnormal   Collection Time: 08/07/22  8:39 PM  Result Value Ref Range   Color, Urine YELLOW YELLOW   APPearance CLEAR CLEAR   Specific Gravity, Urine 1.010 1.005 - 1.030   pH 5.0 5.0 - 8.0   Glucose, UA 100 (A) NEGATIVE mg/dL   Hgb urine dipstick SMALL (A) NEGATIVE   Bilirubin Urine NEGATIVE NEGATIVE   Ketones, ur NEGATIVE NEGATIVE mg/dL   Protein, ur NEGATIVE NEGATIVE mg/dL   Nitrite NEGATIVE NEGATIVE   Leukocytes,Ua NEGATIVE NEGATIVE    Comment: Performed at [facility], [address] [place], [place] [address]  Urinalysis, Microscopic (reflex)     Status: Abnormal   Collection Time: 08/07/22  8:39 PM  Result Value Ref Range   RBC / HPF >50 0 - 5 RBC/hpf   WBC, UA 21-50 0 - 5 WBC/hpf   Bacteria, UA FEW (A) NONE SEEN   Squamous Epithelial / HPF 0-5 0 - 5 /HPF    Comment: Performed at [facility], [address] [place], [place] [address]  CBC with Differential     Status: Abnormal   Collection Time: 08/07/22  8:43 PM  Result Value Ref Range   WBC 15.6 (H) 4.0 - 10.5 K/uL   RBC 4.06 3.87 - 5.11 MIL/uL   Hemoglobin 12.8 12.0 - 15.0 g/dL   HCT 09.8 11.9 - 14.7 %  MCV 92.1 80.0 - 100.0 fL   MCH 31.5 26.0 - 34.0 pg   MCHC 34.2 30.0 - 36.0 g/dL   RDW 40.9 81.1 - 91.4 %   Platelets 255 150 - 400 K/uL   nRBC 0.0 0.0 - 0.2 %   Neutrophils Relative % 83 %   Neutro Abs 12.8 (H) 1.7 - 7.7 K/uL   Lymphocytes Relative 8 %   Lymphs Abs 1.3 0.7 - 4.0 K/uL   Monocytes Relative 9 %   Monocytes Absolute 1.4 (H) 0.1 - 1.0 K/uL   Eosinophils Relative 0 %   Eosinophils Absolute 0.0 0.0 - 0.5 K/uL   Basophils Relative 0 %   Basophils Absolute 0.0 0.0 - 0.1 K/uL   Immature Granulocytes 0 %   Abs Immature Granulocytes 0.04 0.00 - 0.07 K/uL    Comment: Performed at [facility], [address] [place], [place] [address]  Comprehensive metabolic panel     Status: Abnormal   Collection Time: 08/07/22  8:43 PM  Result Value Ref Range    Sodium 135 135 - 145 mmol/L   Potassium 3.2 (L) 3.5 - 5.1 mmol/L   Chloride 99 98 - 111 mmol/L   CO2 28 22 - 32 mmol/L   Glucose, Bld 133 (H) 70 - 99 mg/dL    Comment: Glucose reference range applies only to samples taken after fasting for at least 8 hours.   BUN 11 6 - 20 mg/dL   Creatinine, Ser 6.21 0.44 - 1.00 mg/dL   Calcium 8.6 (L) 8.9 - 10.3 mg/dL   Total Protein 6.7 6.5 - 8.1 g/dL   Albumin 3.6 3.5 - 5.0 g/dL   AST 28 15 - 41 U/L   ALT 23 0 - 44 U/L   Alkaline Phosphatase 53 38 - 126 U/L   Total Bilirubin 0.2 (L) 0.3 - 1.2 mg/dL   GFR, Estimated >30 >86 mL/min    Comment: (NOTE) Calculated using the CKD-EPI Creatinine Equation (2021)    Anion gap 8 5 - 15    Comment: Performed at [facility], [address] [place], [place] [address]  Lipase, blood     Status: None   Collection Time: 08/07/22  8:43 PM  Result Value Ref Range   Lipase 42 11 - 51 U/L    Comment: Performed at [facility], [address] [place], [place] [address]  hCG, serum, qualitative     Status: None   Collection Time: 08/07/22  8:43 PM  Result Value Ref Range   Preg, Serum NEGATIVE NEGATIVE    Comment:        THE SENSITIVITY OF THIS METHODOLOGY IS >10 mIU/mL. Performed at [facility], [address] [place], [place] [address]    CT ABDOMEN PELVIS W CONTRAST  Result Date: 08/07/2022 CLINICAL DATA:  Abdominal pain. EXAM: CT ABDOMEN AND PELVIS WITH CONTRAST TECHNIQUE: Multidetector CT imaging of the abdomen and pelvis was performed using the standard protocol following bolus administration of intravenous contrast. RADIATION DOSE REDUCTION: This exam was performed according to the departmental dose-optimization program which includes automated exposure control, adjustment of the mA and/or kV according to patient size and/or use of iterative reconstruction technique. CONTRAST:  OMNIPAQUE IOHEXOL 300 MG/ML  SOLN COMPARISON:  None Available. FINDINGS: Lower chest:  The visualized lung bases are clear. No intra-abdominal free air or free fluid. Hepatobiliary: No focal liver abnormality is seen. No gallstones, gallbladder wall thickening, or  biliary dilatation. Pancreas: Unremarkable. No pancreatic ductal dilatation or surrounding inflammatory changes. Spleen: Normal in size without focal abnormality. Adrenals/Urinary Tract: The right adrenal glands unremarkable. There is a 2 cm indeterminate left adrenal nodule, possibly adenoma. This can be better characterized with MRI without and with contrast on a nonemergent/outpatient basis. Small left renal upper pole cyst. There is no hydronephrosis on either side. The visualized ureters and the urinary bladder appear unremarkable. Stomach/Bowel: Several small scattered colonic diverticula without active inflammatory changes. There is no bowel obstruction. The appendix is enlarged and inflamed measuring up to 11 mm in diameter. There is a stone in the lumen of the appendix. The appendix is located in the right lower quadrant posterior to the cecum and ascending colon. No drainable fluid collection/abscess. No evidence of perforation. Vascular/Lymphatic: The abdominal aorta and IVC are unremarkable. No portal venous gas. There is no adenopathy. Reproductive: The uterus is anteverted.  No adnexal masses. Other: None Musculoskeletal: No acute or significant osseous findings. IMPRESSION: 1. Acute appendicitis. No abscess or evidence of perforation. 2. Colonic diverticulosis. No bowel obstruction. 3. Indeterminate left adrenal nodule, possibly adenoma. This can be better characterized with MRI without and with contrast on a nonemergent/outpatient basis. At Electronically Signed   By: Elgie Collard M.D.   On: 08/07/2022 21:45    Review of Systems  All other systems reviewed and are negative.   Blood pressure 115/74, pulse 84, temperature 98.8 F (37.1 C), temperature source Oral, resp. rate 16, height 5\' 6"  (1.676 m), weight 66.7  kg, last menstrual period 07/31/2022, SpO2 98 %. Physical Exam Constitutional:      General: She is not in acute distress.    Appearance: She is well-developed and normal weight. She is not ill-appearing or diaphoretic.  HENT:     Head: Normocephalic and atraumatic.  Cardiovascular:     Rate and Rhythm: Normal rate and regular rhythm.  Pulmonary:     Effort: Pulmonary effort is normal. No respiratory distress.  Abdominal:     General: Abdomen is flat.     Comments: There is tenderness with guarding the right lower quadrant.  She has a well-healed Pfannenstiel incision as well as small laparoscopic incisions  Skin:    General: Skin is warm and dry.     Findings: No erythema.  Neurological:     General: No focal deficit present.     Mental Status: She is alert.  Psychiatric:        Mood and Affect: Mood normal.        Behavior: Behavior normal.      Assessment/Plan Acute appendicitis  I have reviewed the CAT scan of her abdomen pelvis.  She has findings consistent with acute appendicitis with an appendicolith and it appears retrocecal.  Given these findings, a laparoscopic appendectomy is recommended.  I discussed the diagnosis and reasons for surgery with her in detail.  I explained the surgical procedure in detail.  We discussed the risks which includes but is not limited to bleeding, infection, injury to surrounding structures, the need to convert to an open procedure, appendiceal stump leak, cardiopulmonary issues with anesthesia, the need for drain postoperatively, postoperative recovery, etc.  She understands and wished to proceed with surgery which will be scheduled.  Moderate medical decision making  Abigail Miyamoto, MD 08/08/2022, 9:24 AM

## 2022-08-09 ENCOUNTER — Encounter (HOSPITAL_COMMUNITY): Payer: Self-pay | Admitting: Surgery

## 2022-08-09 LAB — SURGICAL PATHOLOGY

## 2023-04-21 IMAGING — US US BREAST*L* LIMITED INC AXILLA
1 series · 14 of 16 positions shown · non-contrast
Comparison: Previous exam(s).

CLINICAL DATA: 44-year-old female presenting for screening recall
for a possible left breast mass.

EXAM:
ULTRASOUND OF THE LEFT BREAST

[Series 1: us breast*left* limited inc axilla · 0.06mm/px · 14 of 16 slices shown]
[im 1/16]
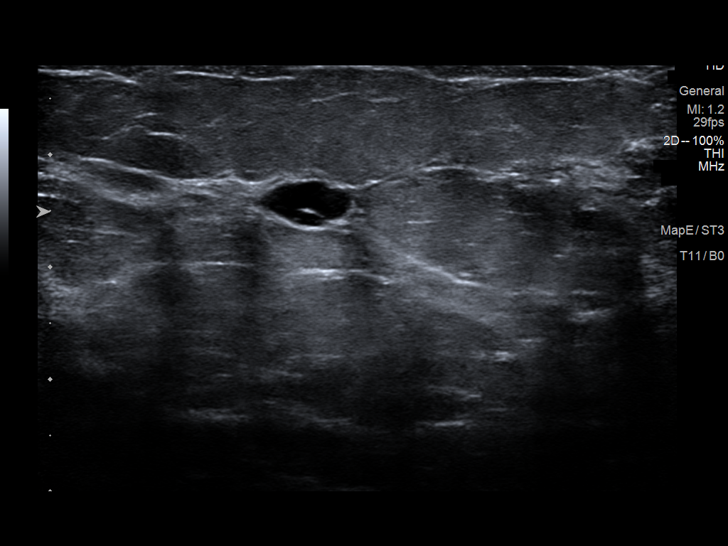
[im 2/16]
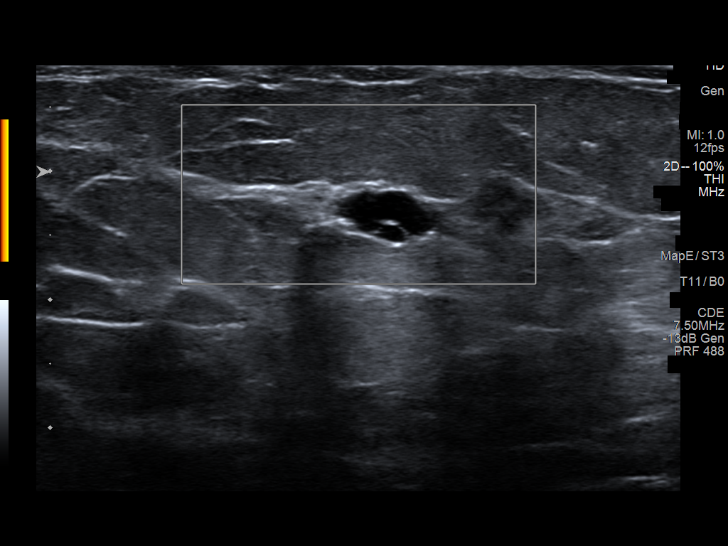
[im 3/16]
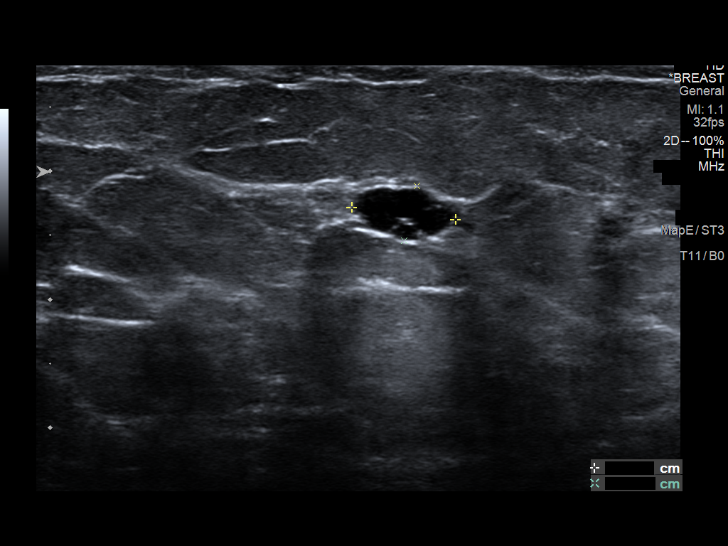
[im 5/16]
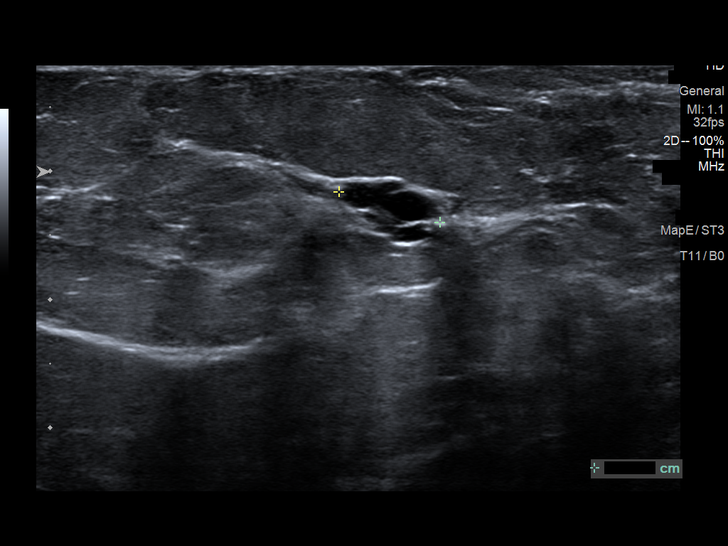
[im 6/16]
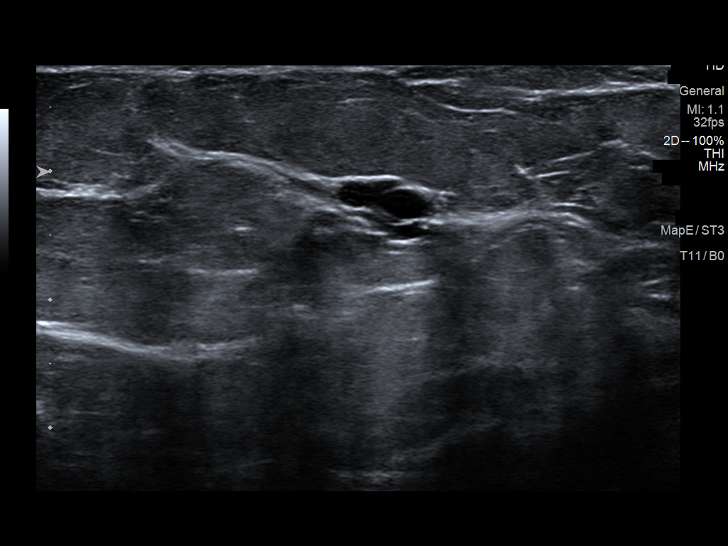
[im 7/16]
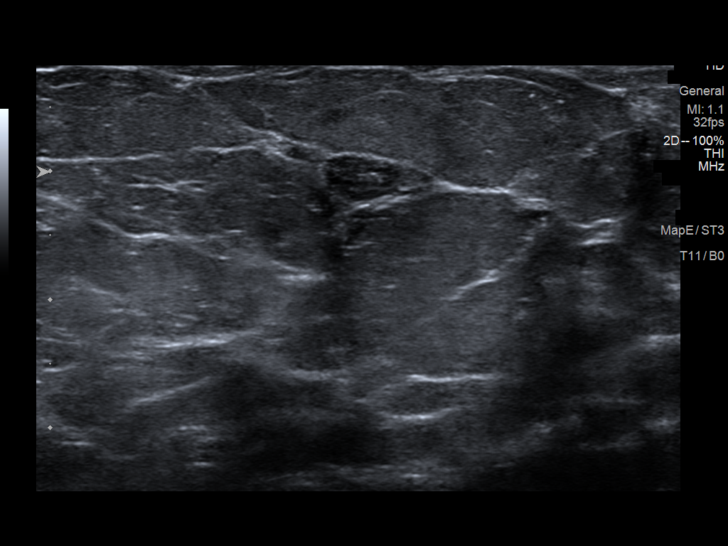
[im 8/16]
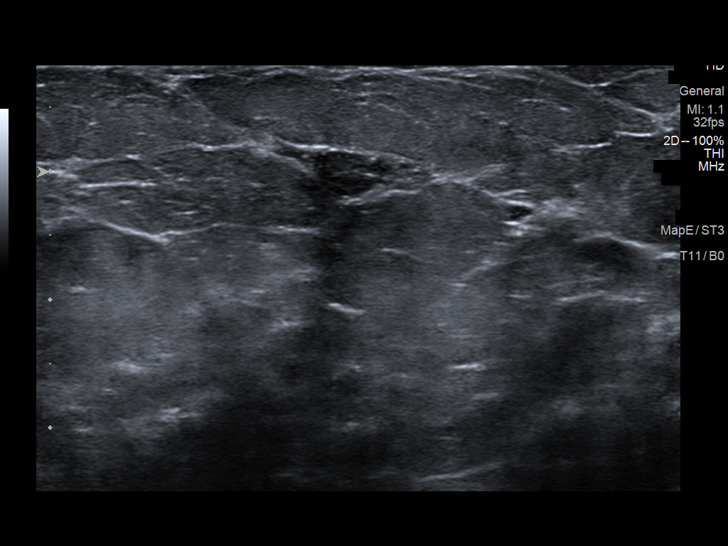
[im 9/16]
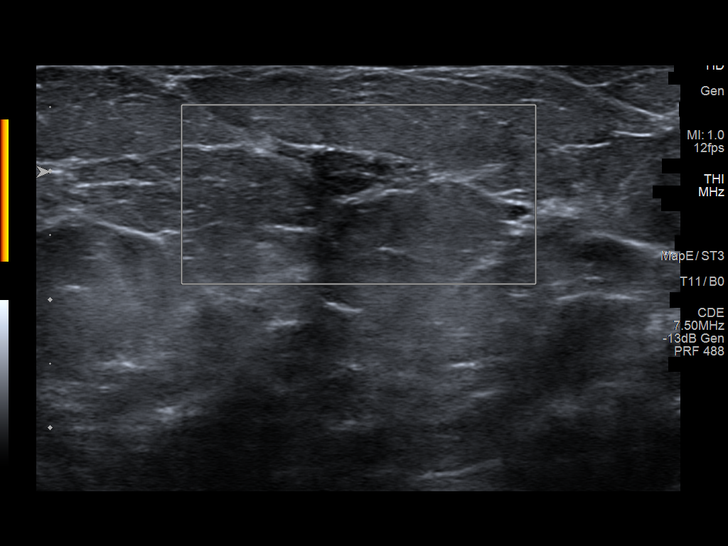
[im 10/16]
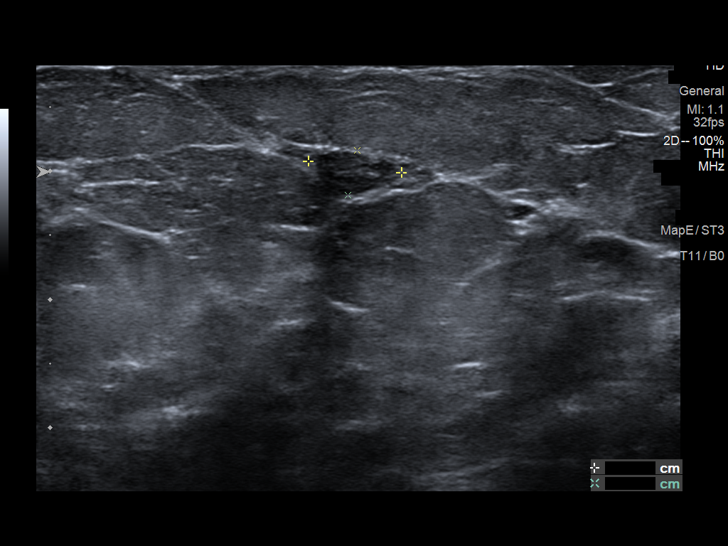
[im 11/16]
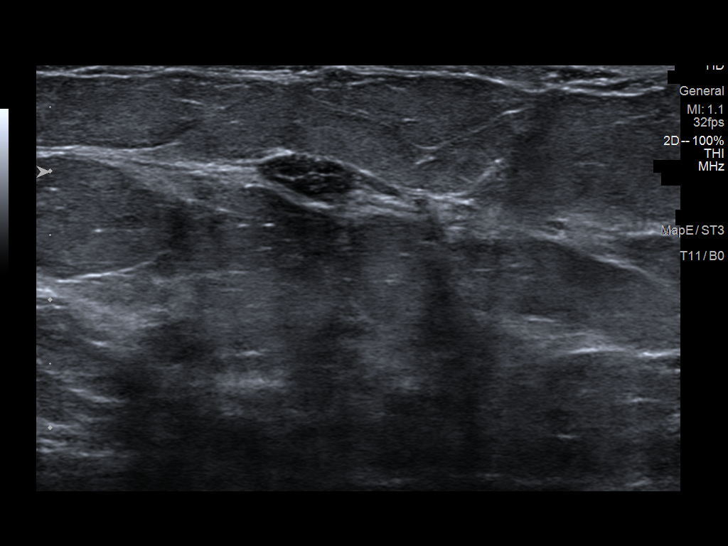
[im 13/16]
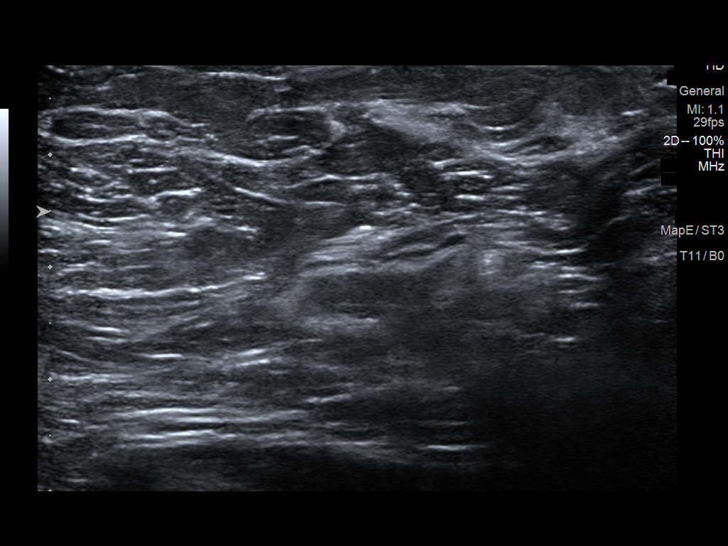
[im 14/16]
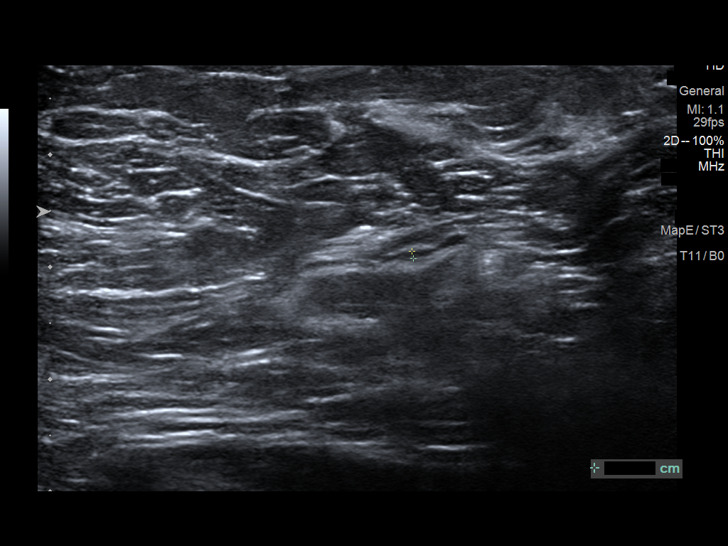
[im 15/16]
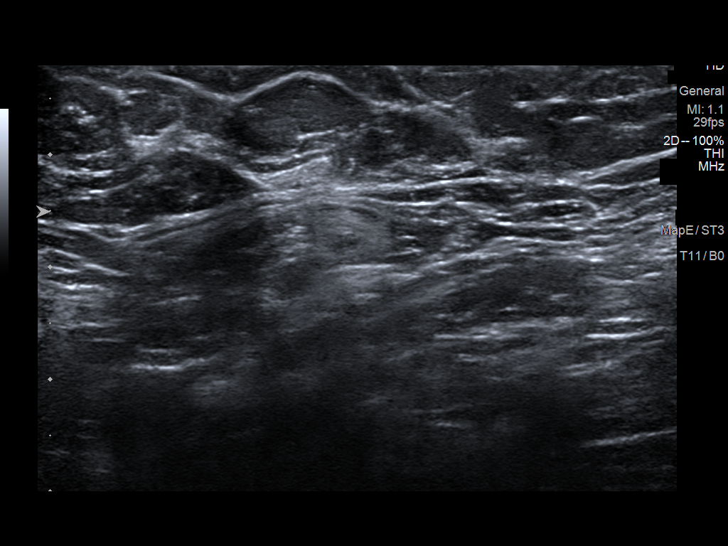
[im 16/16]
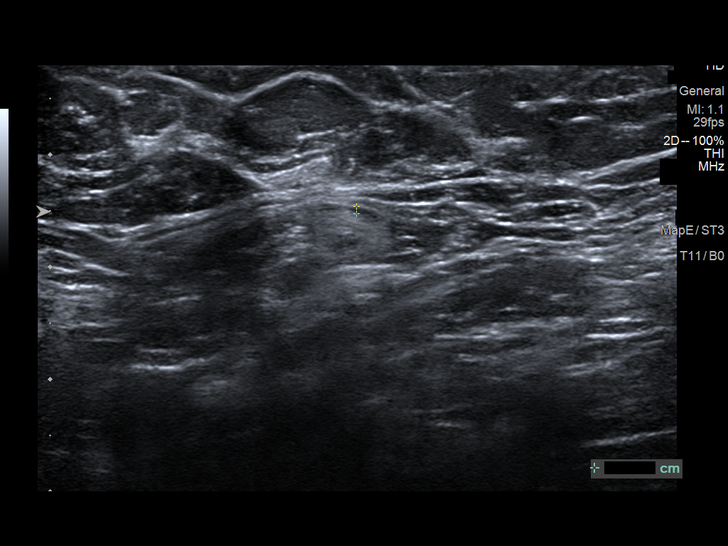

[14 of 16 positions shown; findings below may reference images not displayed]

FINDINGS: Ultrasound targeted to the medial left breast demonstrates multiple
clusters of anechoic cysts. The cyst which may represent the mass
seen on the screening mammogram is seen at 7 o'clock, 3 cm from the
nipple measuring 8 x 4 x 8 mm.

At 9 o'clock, 3 cm from the nipple there is a hypoechoic oval
circumscribed mass with a thin internal septation measuring 8 x 4 x
7 mm. Ultrasound of the left axilla demonstrates multiple
normal-appearing lymph nodes.
IMPRESSION: 1. There is a likely benign left breast mass at 9 o'clock, which may
represent a complicated cyst versus fibroadenoma.

2. The mass on the screening mammogram likely corresponds with the
benign cluster of cysts seen in the left breast at 7 o'clock.

RECOMMENDATION:
Six-month follow-up left breast ultrasound.

I have discussed the findings and recommendations with the patient.
If applicable, a reminder letter will be sent to the patient
regarding the next appointment.

BI-RADS CATEGORY  3: Probably benign.
